# Patient Record
Sex: Female | Born: 1980 | Race: White | Hispanic: No | Marital: Married
Health system: Southern US, Community
[De-identification: ages and names within clinical notes are randomized; demographics above are authoritative.]

---

## 2011-06-17 ENCOUNTER — Emergency Department: Payer: Self-pay | Admitting: Emergency Medicine

## 2011-10-20 ENCOUNTER — Observation Stay: Payer: Self-pay | Admitting: Obstetrics and Gynecology

## 2011-10-20 LAB — URINALYSIS, COMPLETE
Bilirubin,UR: NEGATIVE
Blood: NEGATIVE
Glucose,UR: NEGATIVE mg/dL (ref 0–75)
Nitrite: NEGATIVE
Ph: 6 (ref 4.5–8.0)
Specific Gravity: 1.006 (ref 1.003–1.030)
Squamous Epithelial: <1
WBC UR: <1 /HPF (ref 0–5)

## 2011-11-03 ENCOUNTER — Observation Stay: Payer: Self-pay | Admitting: Obstetrics and Gynecology

## 2011-11-07 ENCOUNTER — Observation Stay: Payer: Self-pay

## 2011-11-07 ENCOUNTER — Inpatient Hospital Stay: Payer: Self-pay | Admitting: Obstetrics and Gynecology

## 2011-11-07 LAB — CBC WITH DIFFERENTIAL/PLATELET
Basophil #: 0 10*3/uL (ref 0.0–0.1)
Eosinophil %: 0.1 %
HCT: 41.4 % (ref 35.0–47.0)
HGB: 14 g/dL (ref 12.0–16.0)
Lymphocyte %: 9.3 %
MCH: 30.6 pg (ref 26.0–34.0)
MCHC: 33.8 g/dL (ref 32.0–36.0)
MCV: 91 fL (ref 80–100)
Monocyte #: 0.7 x10 3/mm (ref 0.2–0.9)
Neutrophil #: 13.5 10*3/uL — ABNORMAL HIGH (ref 1.4–6.5)
Neutrophil %: 86.1 %
Platelet: 216 10*3/uL (ref 150–440)
RDW: 13.4 % (ref 11.5–14.5)

## 2011-11-08 LAB — HEMATOCRIT: HCT: 32.9 % — ABNORMAL LOW (ref 35.0–47.0)

## 2011-11-13 ENCOUNTER — Ambulatory Visit: Payer: Self-pay | Admitting: Pediatrics

## 2012-10-24 IMAGING — CR DG ANKLE COMPLETE 3+V*L*
1 series · 5 of 5 positions shown · non-contrast
Comparison: none

REASON FOR EXAM: injury  pt is approx 5 months PREGNANT
COMMENTS:   LMP: > one month ago

PROCEDURE:     DXR - DXR ANKLE LEFT COMPLETE  - June 17, 2011  [DATE]
RESULT:     There is no evidence of fracture, dislocation, or malalignment.

[Series 1: x ankle ap left · 0.14mm/px · 5 of 5 slices shown]
[im 1/5]
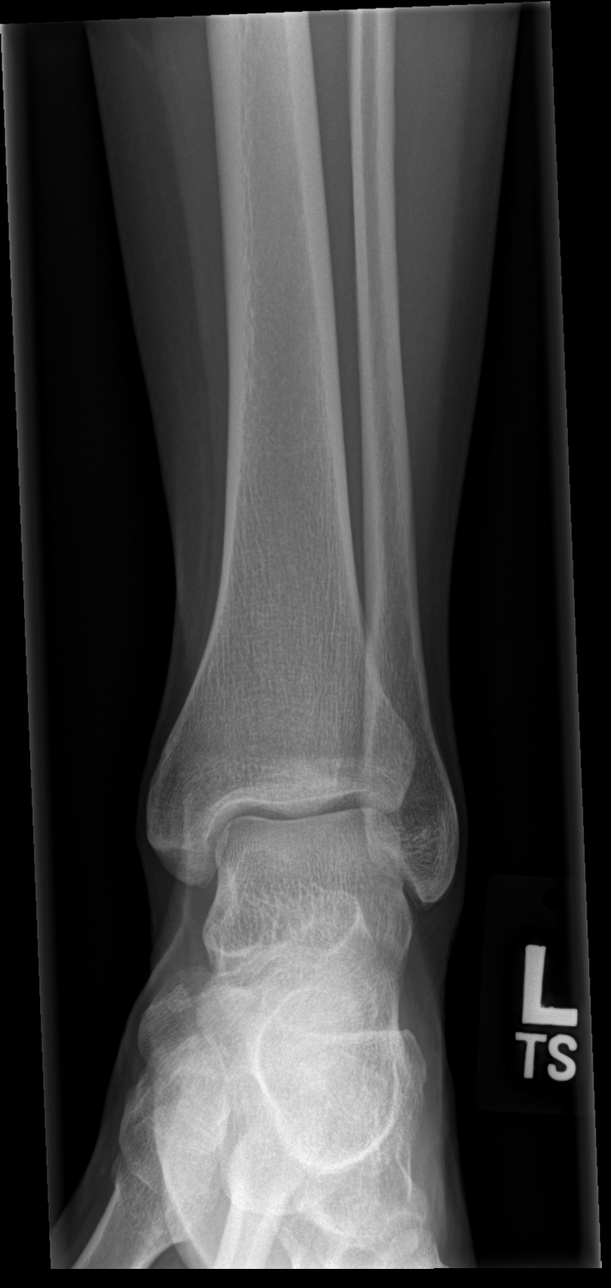
[im 2/5]
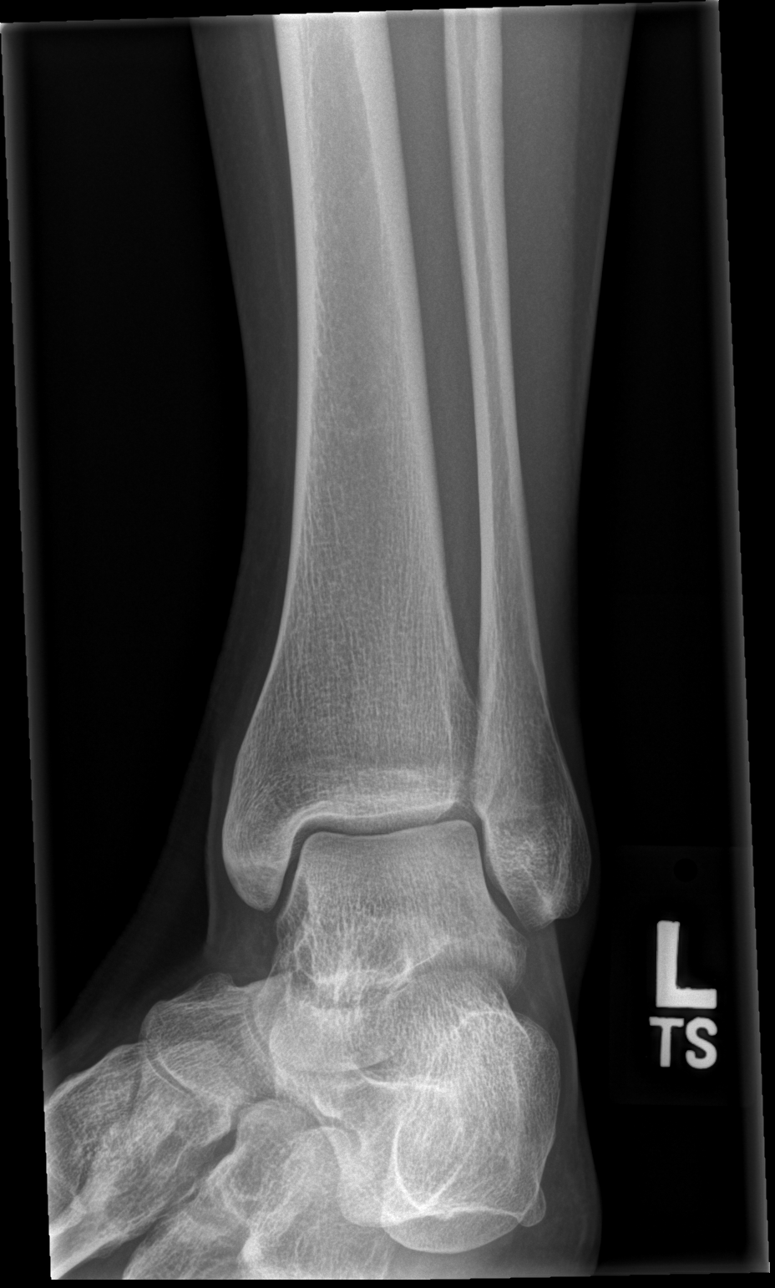
[im 3/5]
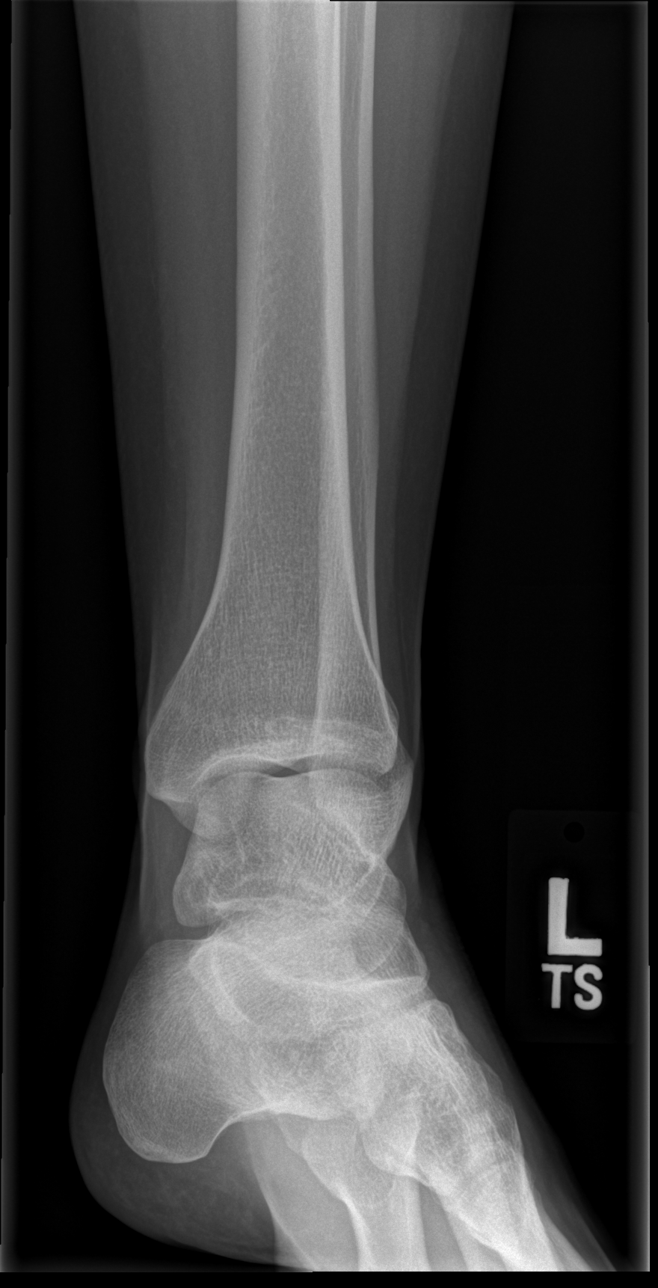
[im 4/5]
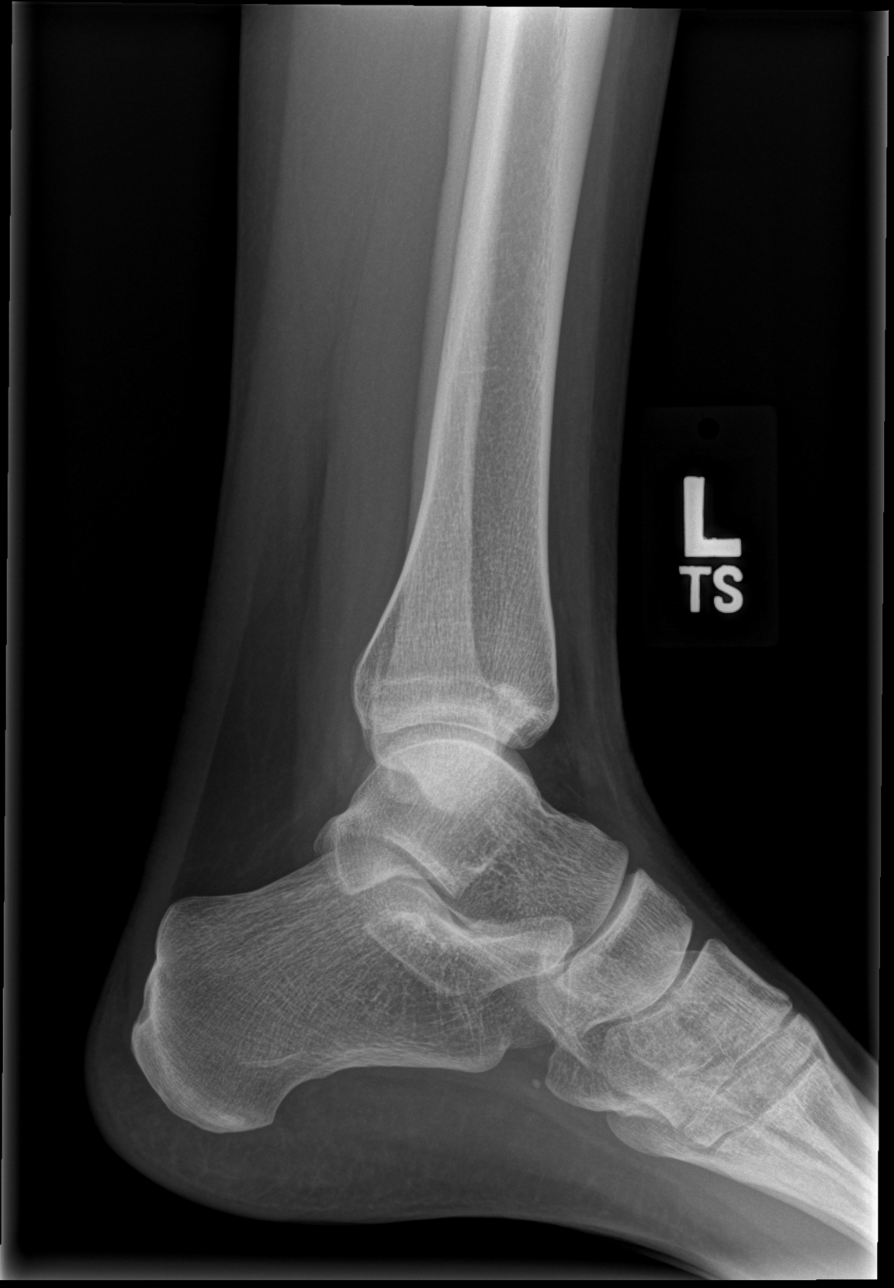
[im 5/5]
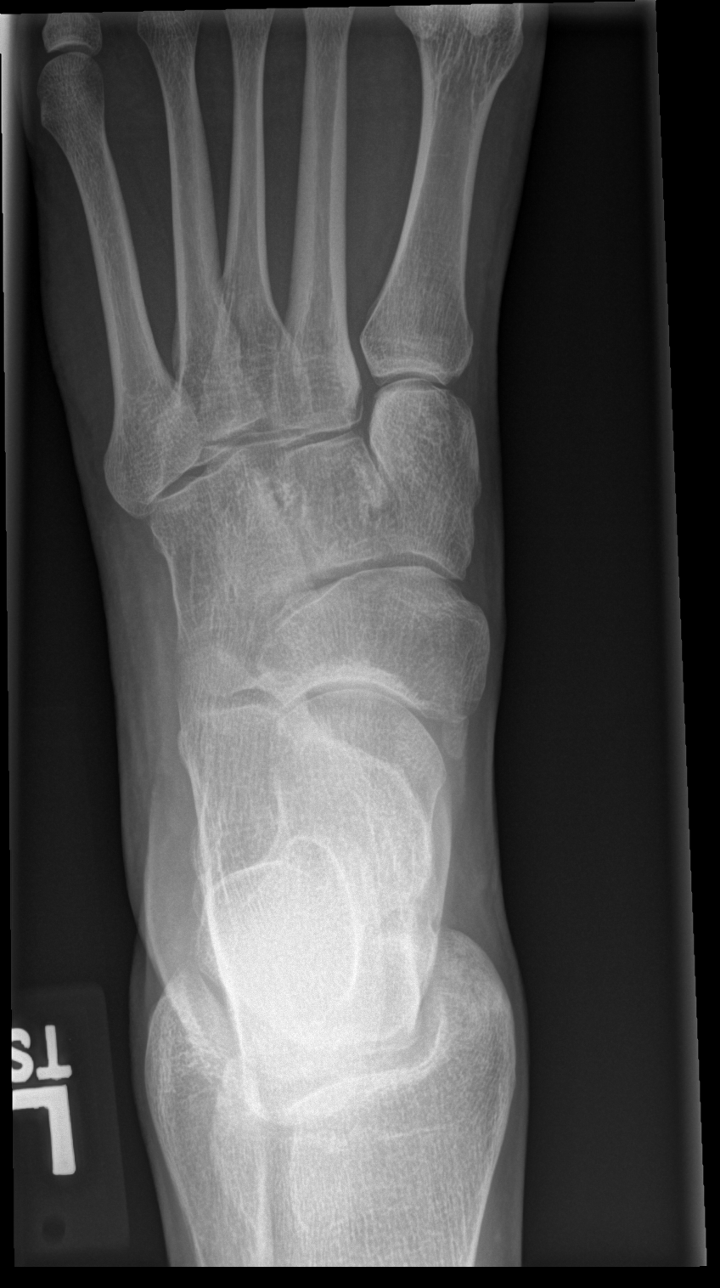

[5 of 5 positions shown; findings below may reference images not displayed]

IMPRESSION: 1. No evidence of acute abnormalities.
2. If there are persistent complaints of pain or persistent clinical
concern, a repeat evaluation in 7-10 days is recommended if clinically
warranted.

## 2014-10-04 NOTE — H&P (Signed)
L&D Evaluation:  History:   HPI 34 yo G1P0 34 yo G1 at 7866w6d gestation presenting to L&D for intermitten contractions every 4-5 minutes.  +FM, no LOF,some spotting since leaving earlier this morning at which time patient was noted to be 1cm dilated.    PNC uncomplicated thus far.  O negative, HIV neg, HBsAG neg, RPR NR, RI, VZI, GBS negative    Presents with contractions, vaginal bleeding    Patient's Medical History Migraines, depression    Patient's Surgical History wisdom teeth extraction, surgery clavicle fracture    Medications Pre Natal Vitamins    Allergies NKDA    Social History none    Family History Non-Contributory   ROS:   ROS All systems were reviewed.  HEENT, CNS, GI, GU, Respiratory, CV, Renal and Musculoskeletal systems were found to be normal.   Exam:   Vital Signs BP >140/90    Urine Protein not completed    General no apparent distress    Mental Status clear    Chest clear    Heart normal sinus rhythm    Abdomen gravid, non-tender    Estimated Fetal Weight Average for gestational age    Fetal Position vtx    Back no CVAT    Edema Trace BLE    Pelvic no external lesions, 3/C/-2    Mebranes Intact    FHT normal rate with no decels, reactive tracing    Fetal Heart Rate 140    Ucx regular, 5min    Skin dry    Lymph no lymphadenopathy   Impression:   Impression Braxton Hicks Contractions   Plan:   Plan EFM/NST    Comments Admit for term labor, stadol written for pain, may decide to proceed with epidural pending labs    Follow Up Appointment already scheduled   Electronic Signatures: Allison Shannon, Allison Shannon (MD)  (Signed 13-Jun-13 08:49)  Authored: L&D Evaluation   Last Updated: 13-Jun-13 08:49 by Allison Shannon, Allison Shannon (MD)

## 2014-10-04 NOTE — H&P (Signed)
L&D Evaluation:  History Expanded:   HPI 34 yo G1P0 who presents to L and D with a discharge from her vagina and a sudden onset of pain low in abdomen immediatley preceding the gush of fluid. No bleeding. She has not had any problems since this. she is noticing some pains lower in the abdomen across the bottom.    Gravida 1    Term 0    PreTerm 0    Abortion 0    Living 0    Blood Type O negative    Group B Strep Results (Result >5wks must be treated as unknown) negative    Maternal HIV Negative    Maternal Syphilis Ab Nonreactive    Maternal Varicella Immune    Rubella Results immune    Maternal [facility]Dap Immune    [facility]    Presents with decreased fetal movement, vaginal discharge    Patient's Medical History No Chronic Illness    Patient's Surgical History none    Medications Pre Natal Vitamins    Allergies NKDA    Social History none    Family History Non-Contributory   ROS:   ROS All systems were reviewed.  HEENT, CNS, GI, GU, Respiratory, CV, Renal and Musculoskeletal systems were found to be normal.   Exam:   Vital Signs stable    Urine Protein negative dipstick    General no apparent distress    Mental Status clear    Chest clear    Heart normal sinus rhythm    Abdomen gravid, non-tender    Estimated Fetal Weight Average for gestational age    Fundal Height term    Back no CVAT    Pelvic no external lesions    Mebranes Intact    FHT normal rate with no decels, strictly reactive, and irritability o fhte uterine activity    Fetal Heart Rate 140    Ucx absent    Skin dry    Lymph no lymphadenopathy   Impression:   Impression bladder spasm with vaginal discharge   Plan:   Plan UA, EFM/NST    Comments fern neg nitrazine neg pool neg no wet on the perineum    Follow Up Appointment already scheduled   Electronic Signatures: Adria DevonKlett, Shaquina Gillham (MD)  (Signed 26-May-13 15:24)  Authored: L&D Evaluation   Last Updated:  26-May-13 15:24 by Adria DevonKlett, Romeo Zielinski (MD)

## 2014-10-04 NOTE — H&P (Signed)
L&D Evaluation:  History:   HPI 34 yo G1P0 34 yo G1 at 7045w2d gestation presenting to L&D for intermitten contractions every 10 minutes.  +FM, no LOF, no VB. No IOL scheduled as of yet has follow up on 11/07/2011 with NST/AFI.    PNC uncomplicated thus far    Presents with decreased fetal movement, vaginal discharge    Patient's Medical History Migraines, depression    Patient's Surgical History wisdom teeth extraction, surgery clavicle fracture    Medications Pre Natal Vitamins    Allergies NKDA    Social History none    Family History Non-Contributory   ROS:   ROS All systems were reviewed.  HEENT, CNS, GI, GU, Respiratory, CV, Renal and Musculoskeletal systems were found to be normal.   Exam:   Vital Signs stable    Urine Protein negative dipstick    General no apparent distress    Mental Status clear    Chest clear    Heart normal sinus rhythm    Abdomen gravid, non-tender    Estimated Fetal Weight Average for gestational age    Fetal Position vtx    Back no CVAT    Pelvic no external lesions, ftp-1cm per nursing    Mebranes Intact    FHT normal rate with no decels, reactuve tracing    Fetal Heart Rate 140    Ucx irregular, 10-6820minutes    Ucx Pain Scale 2-3/10    Skin dry    Lymph no lymphadenopathy   Impression:   Impression Braxton Hicks Contractions   Plan:   Plan EFM/NST    Comments - NST reactive - No cervical change over 2-hrs - IOL scheduled for 11/10/11 if patient does not go into labor - Follow up on 11/07/2011 [facility] for NST/AFI postdate surveillance    Follow Up Appointment already scheduled   Electronic Signatures: Lorrene ReidStaebler, Lynford Espinoza M (MD)  (Signed 10-Jun-13 22:08)  Authored: L&D Evaluation   Last Updated: 10-Jun-13 22:08 by Lorrene ReidStaebler, Letica Giaimo M (MD)
